# Patient Record
Sex: Female | Born: 1965 | Hispanic: Yes | Marital: Married
Health system: Southern US, Community
[De-identification: ages and names within clinical notes are randomized; demographics above are authoritative.]

---

## 2005-08-21 ENCOUNTER — Ambulatory Visit: Payer: Self-pay | Admitting: Internal Medicine

## 2006-07-30 IMAGING — CT CT ABD-PELV W/O CM
1 of 2 series · 16 of 32 positions shown, 20 images · non-contrast
Comparison: none

REASON FOR EXAM: LOW BACK PAIN, ABDOMINAL PAIN, EVALUATE FOR STONES
COMMENTS:   CALL REPORT TO:  [phone]

PROCEDURE:     CT  - CT ABDOMEN AND PELVIS W[DATE]  [DATE]
RESULT:     Axial images were obtained from the hemidiaphragms to the pubic
symphysis without intravenous or oral administration of contrast.

[Series 2: stone · axial · 0.63mm/px · z∈[-416,-26]mm · 16 of 142 slices shown, 20 images]
[im 6/142  soft-tissue]
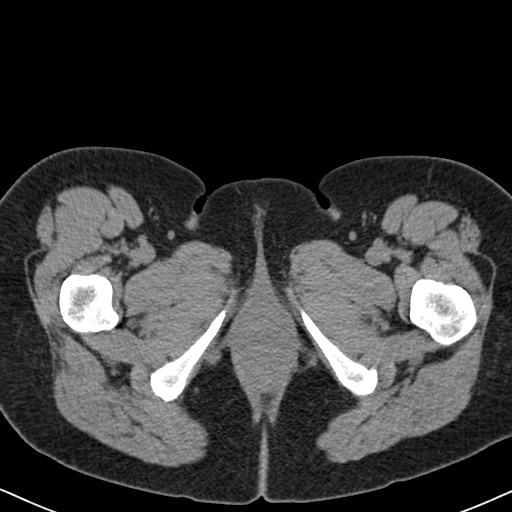
[im 6/142  bone]
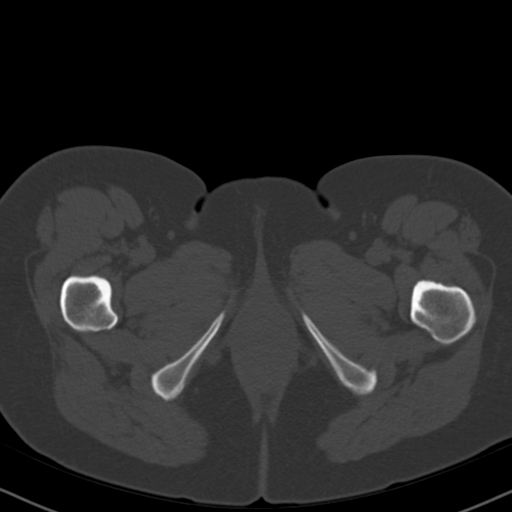
[im 17/142  soft-tissue]
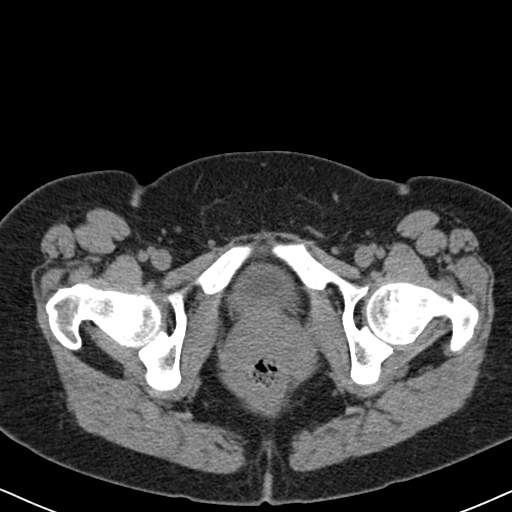
[im 29/142  soft-tissue]
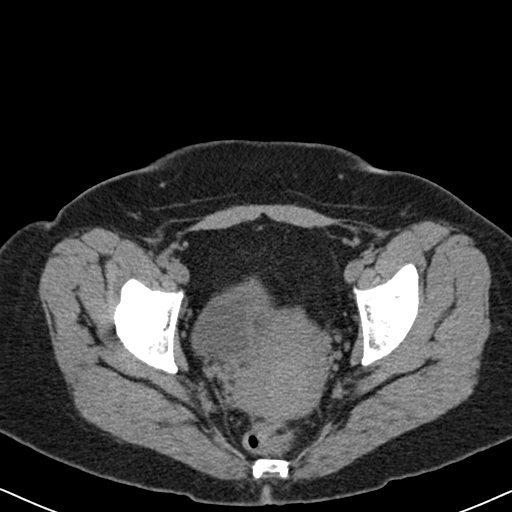
[im 40/142  soft-tissue]
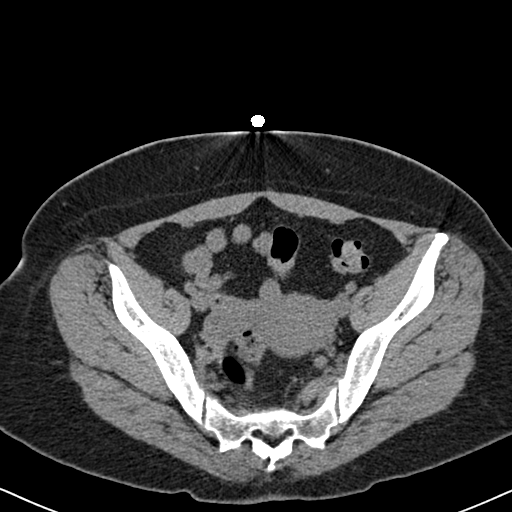
[im 46/142  soft-tissue]
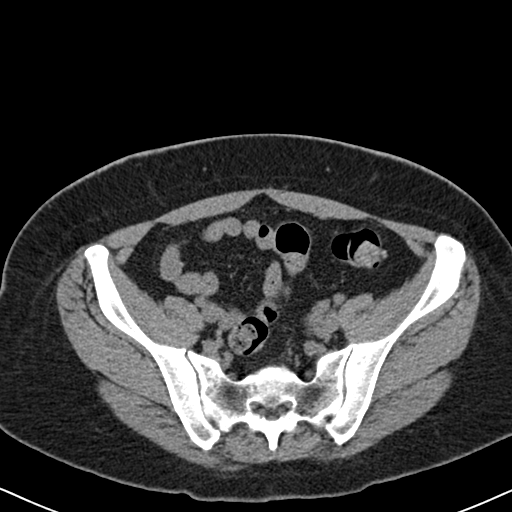
[im 57/142  soft-tissue]
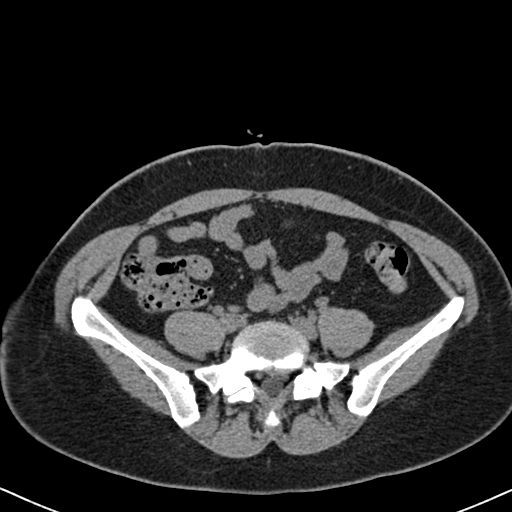
[im 68/142  soft-tissue]
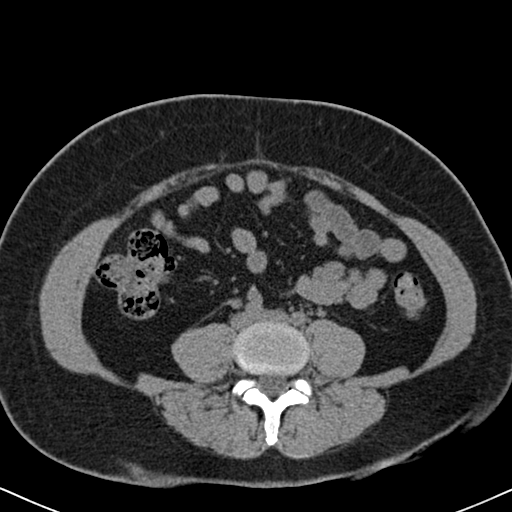
[im 74/142  soft-tissue]
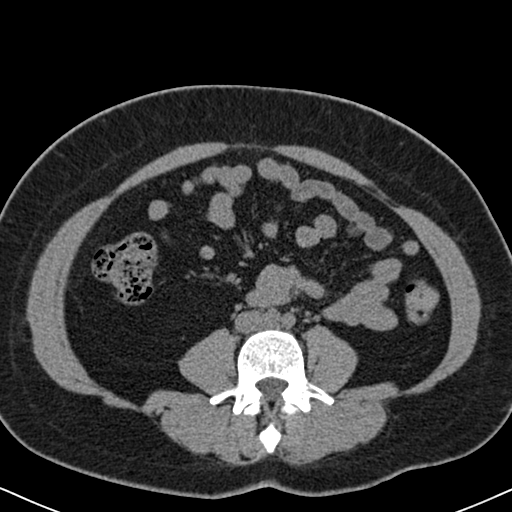
[im 85/142  soft-tissue]
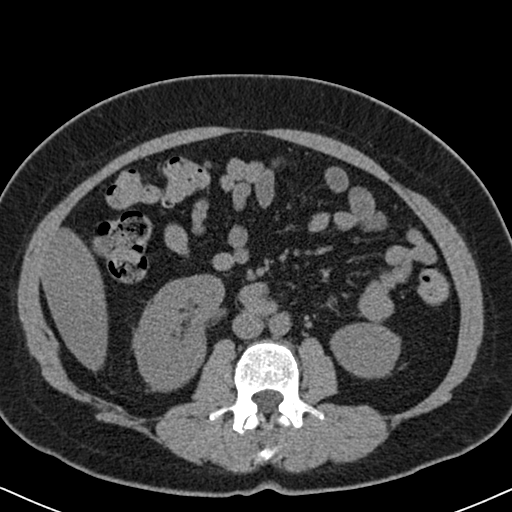
[im 85/142  bone]
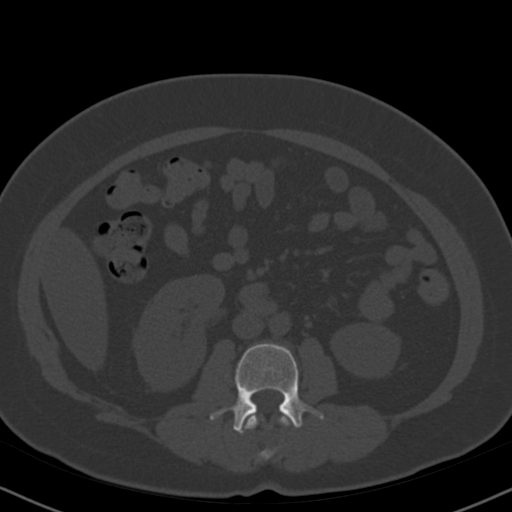
[im 96/142  soft-tissue]
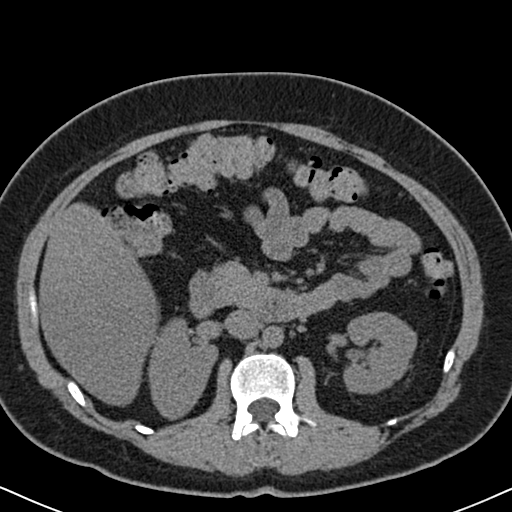
[im 108/142  soft-tissue]
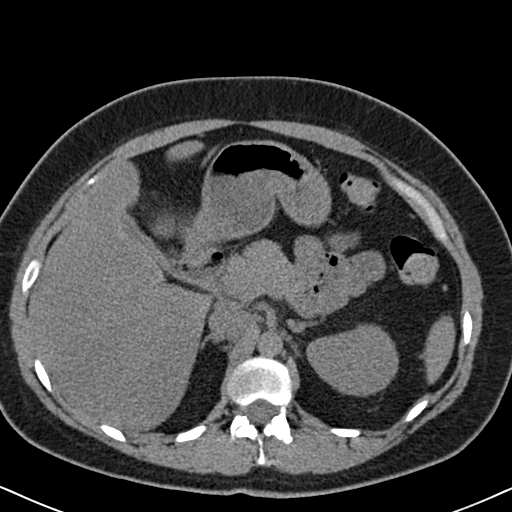
[im 113/142  soft-tissue]
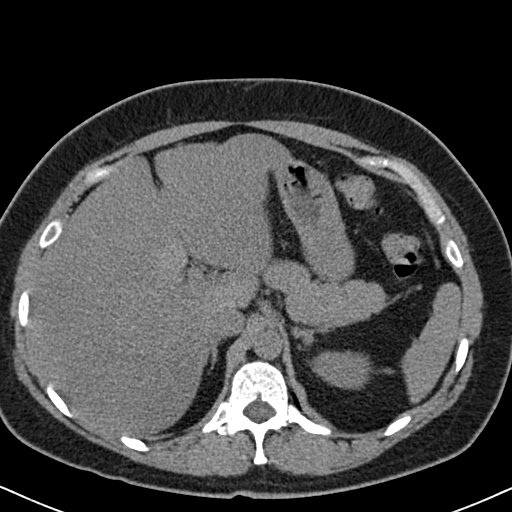
[im 119/142  lung]
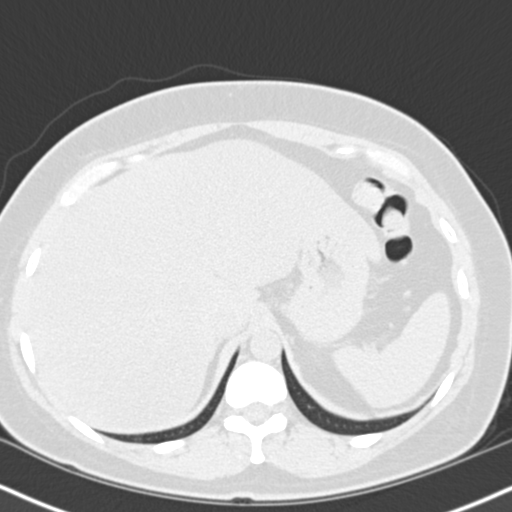
[im 125/142  soft-tissue]
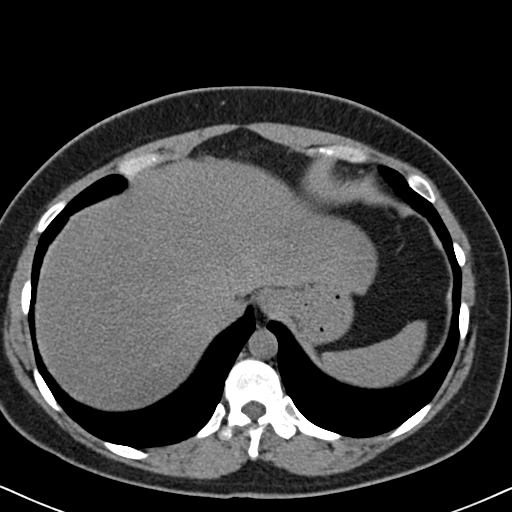
[im 125/142  lung]
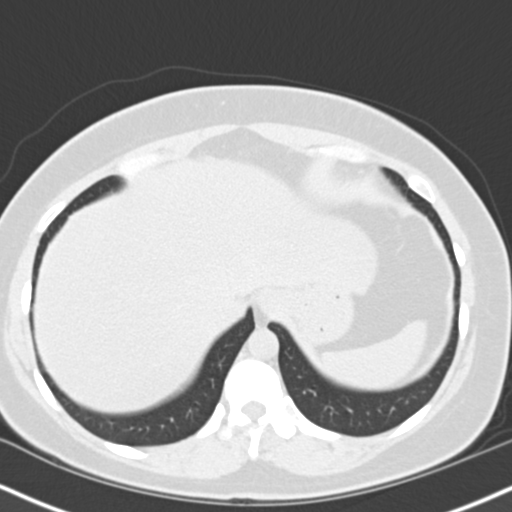
[im 130/142  lung]
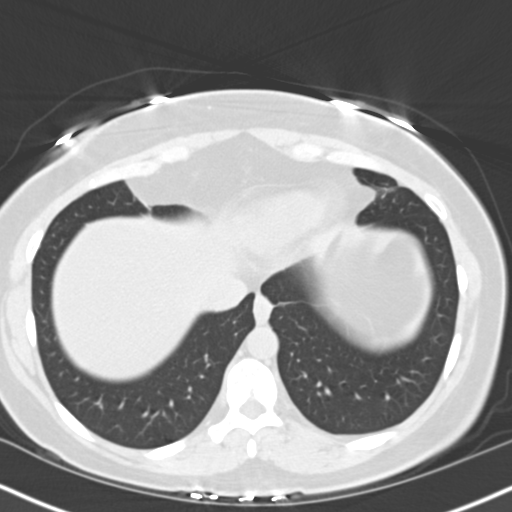
[im 136/142  soft-tissue]
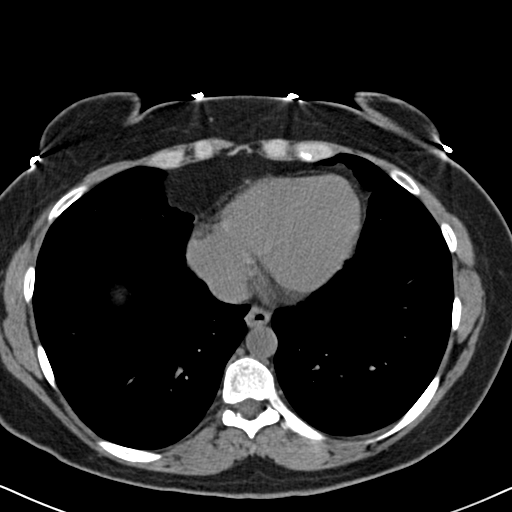
[im 136/142  lung]
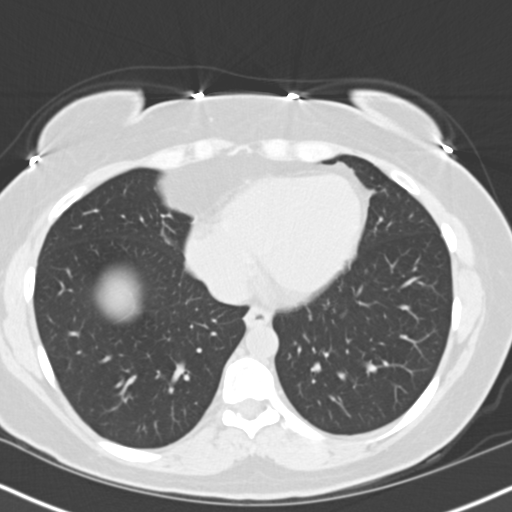

[16 of 32 positions shown; findings below may reference images not displayed]

FINDINGS: No renal or ureteral calculi are identified. No fluid is noted in
the abdomen or pelvis. No hydronephrosis is seen.

No stranding of the fat is noted to suggest an inflammatory process in the
abdomen or pelvis. No major organ abnormality is seen.

The images through the lung bases reveal the lung bases are clear. No
effusions are present.

Incidentally noted are what appears to be a cyst in the RIGHT ovary.
IMPRESSION: No significant abnormality is identified on the unenhanced
abdominal and pelvic CT scan.

The report was called to Dr. Motley at the conclusion of the study.

## 2008-10-19 ENCOUNTER — Ambulatory Visit: Payer: Self-pay | Admitting: Chiropractic Medicine

## 2008-12-31 ENCOUNTER — Encounter: Payer: Self-pay | Admitting: Orthopedic Surgery

## 2009-01-18 ENCOUNTER — Encounter: Payer: Self-pay | Admitting: Orthopedic Surgery

## 2009-09-27 IMAGING — CR DG CHEST 2V
1 series · 2 of 2 positions shown · non-contrast
Comparison: none

REASON FOR EXAM: mid-back pain radiating to ribs and sternum
COMMENTS:

PROCEDURE:     DXR - DXR CHEST PA (OR AP) AND LATERAL  - October 19, 2008  [DATE]
RESULT:     The lung fields are clear. The heart, mediastinal and osseous
structures show no significant abnormalities.

[Series 1: view not recorded · 0.17mm/px · 2 of 2 slices shown]
[im 1/2]
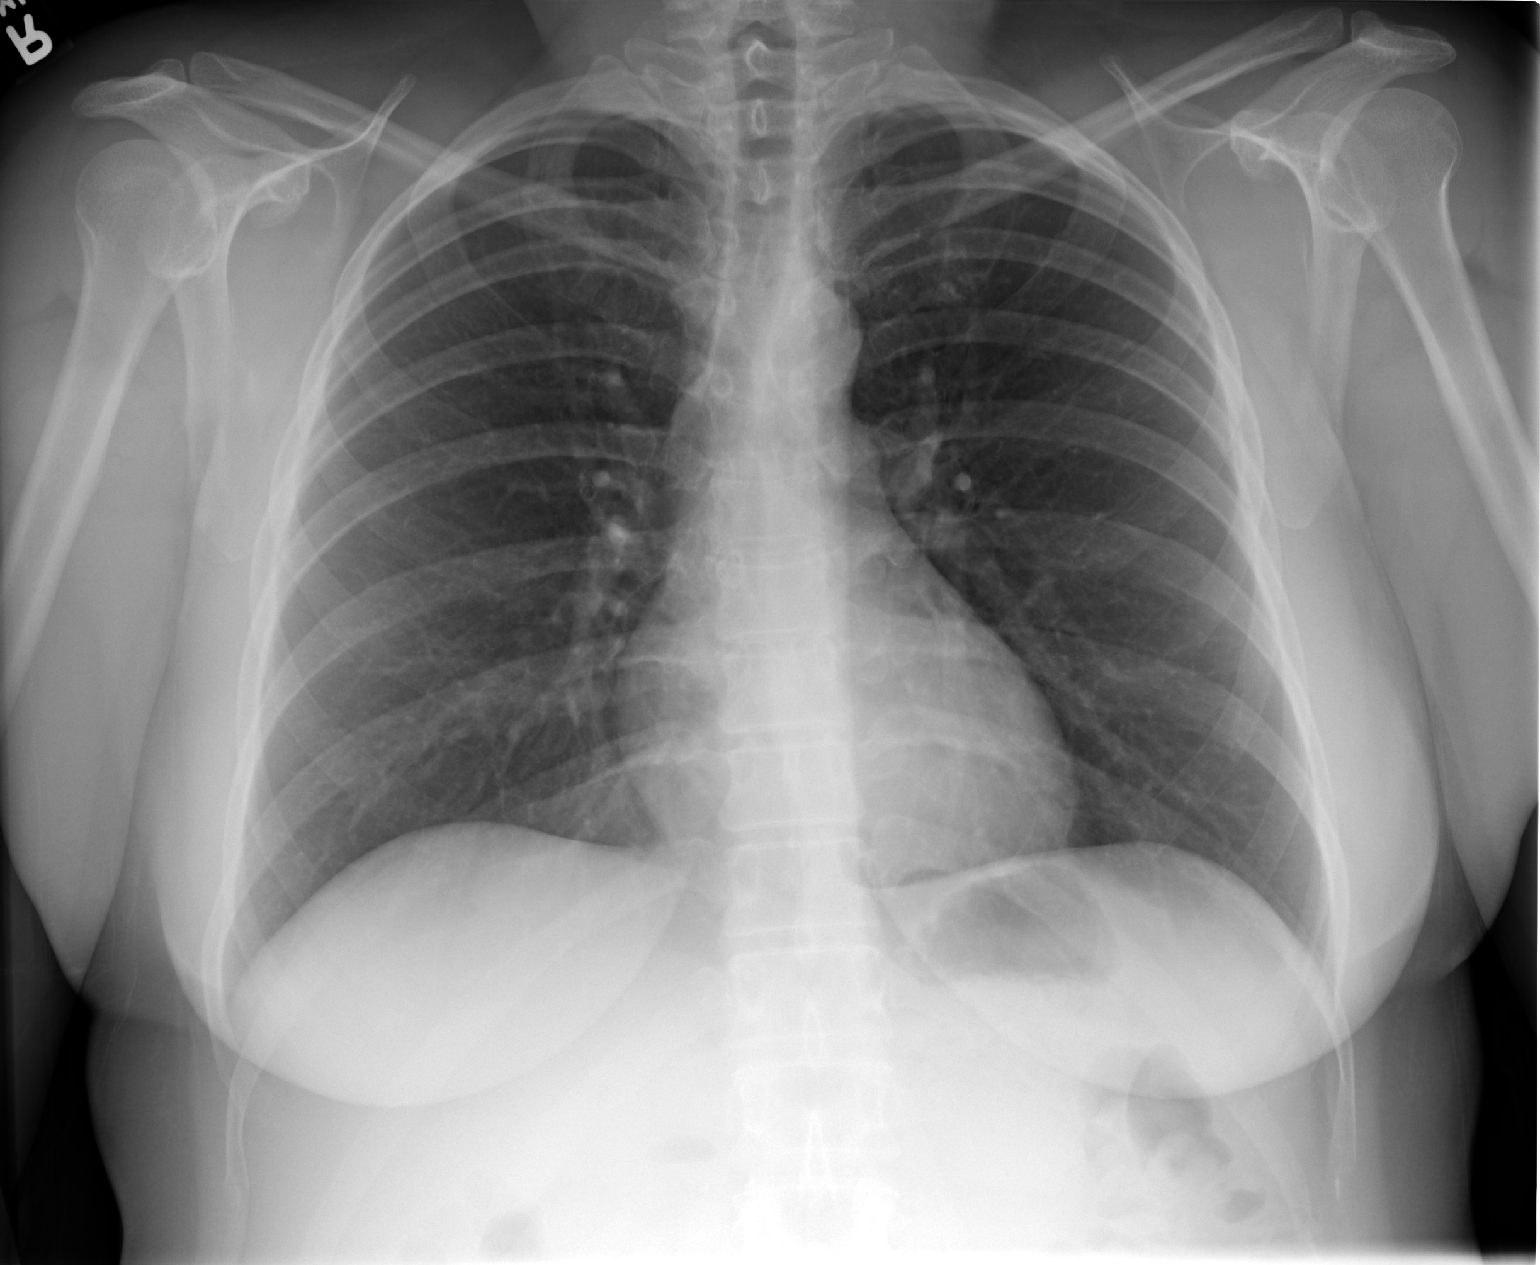
[im 2/2]
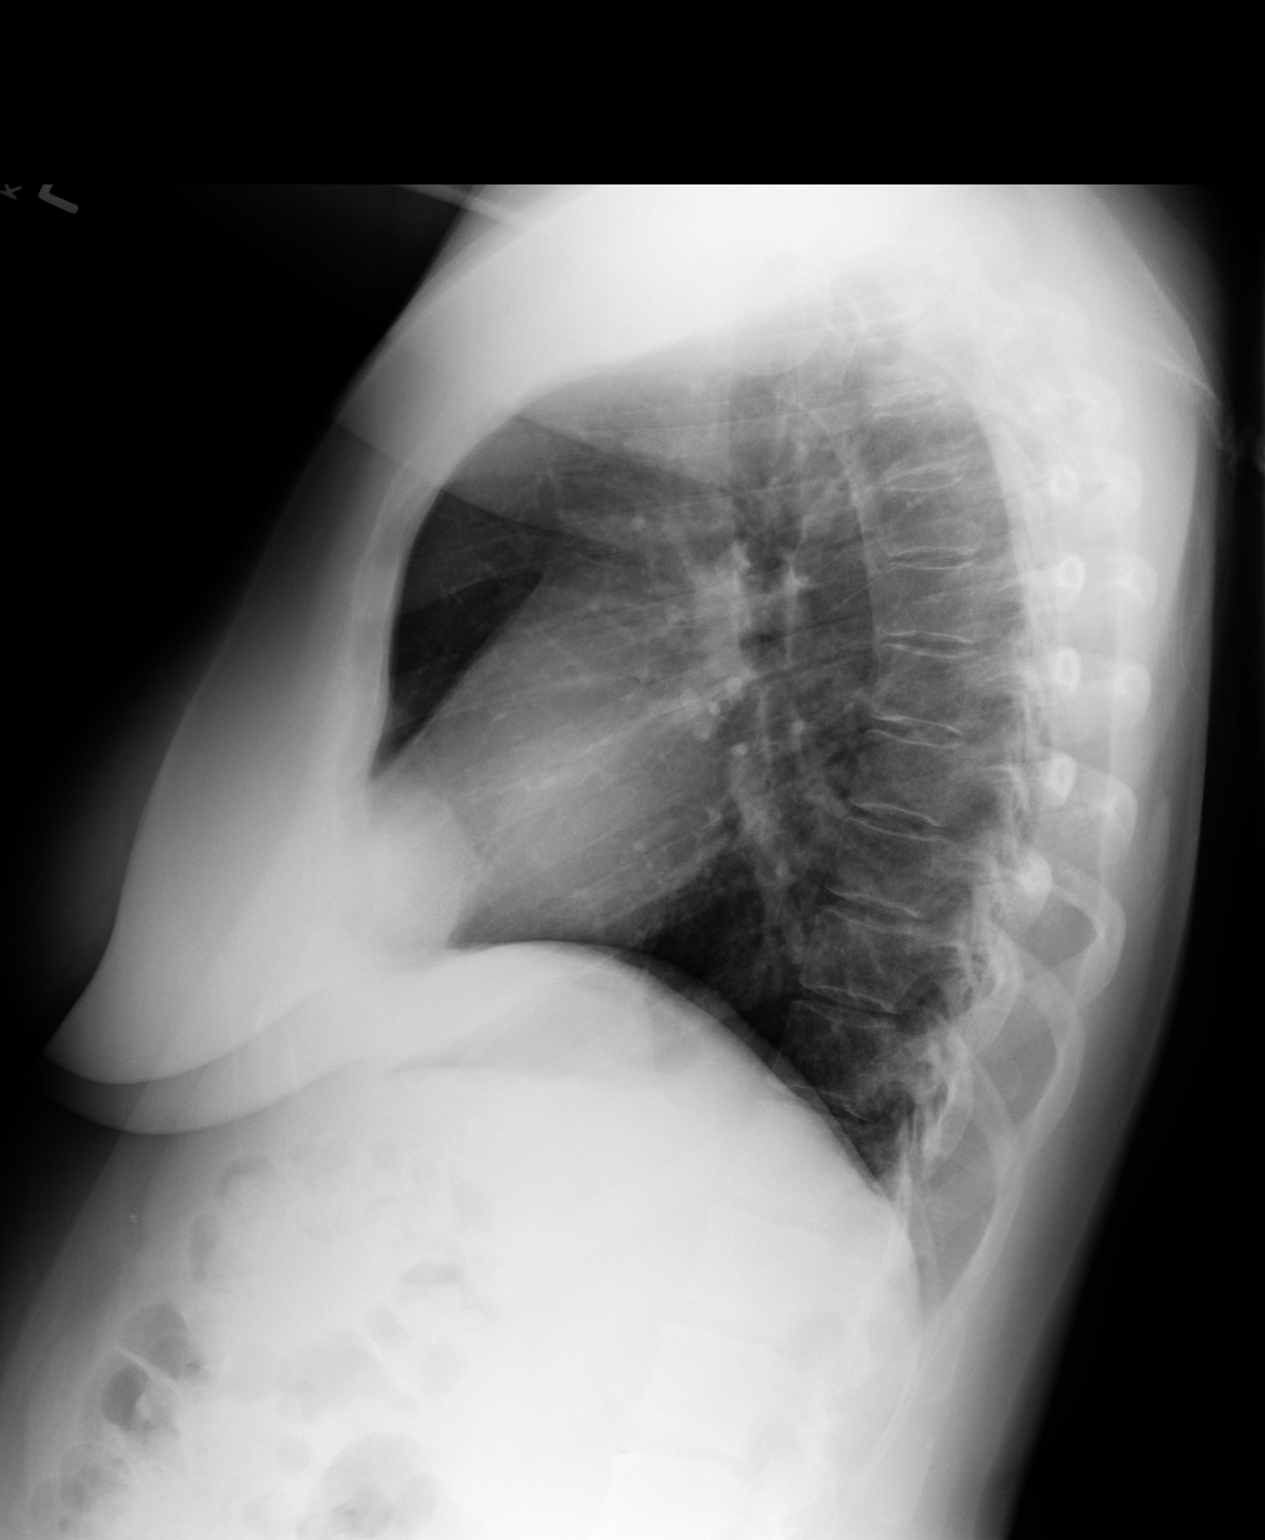

[2 of 2 positions shown; findings below may reference images not displayed]

IMPRESSION: No significant abnormalities are noted.

## 2021-01-03 ENCOUNTER — Emergency Department: Admission: EM | Admit: 2021-01-03 | Discharge: 2021-01-03 | Discharge status: Against Medical Advice | Payer: Self-pay

## 2021-01-03 NOTE — ED Triage Notes (Signed)
First Nurse Note:  In the shower, used a new shampoo that she made up (Rosemary, ginger root, .....) and felt like she was going to passout, tingling in her head, felt painiced.  Called EMS.  Per report.  VS wnl, BP slightly elevated, CBG:  124.
